# Patient Record
Sex: Female | Born: 1970 | Race: White | Hispanic: Yes | Marital: Married | Smoking: Never smoker
Health system: Southern US, Community
[De-identification: ages and names within clinical notes are randomized; demographics above are authoritative.]

## PROBLEM LIST (undated history)

## (undated) DIAGNOSIS — C801 Malignant (primary) neoplasm, unspecified: Secondary | ICD-10-CM

---

## 2007-05-29 ENCOUNTER — Encounter (INDEPENDENT_AMBULATORY_CARE_PROVIDER_SITE_OTHER): Payer: Self-pay | Admitting: Obstetrics and Gynecology

## 2007-05-29 ENCOUNTER — Inpatient Hospital Stay (HOSPITAL_COMMUNITY): Admission: EM | Admit: 2007-05-29 | Discharge: 2007-06-01 | Payer: Self-pay | Admitting: *Deleted

## 2007-05-31 ENCOUNTER — Other Ambulatory Visit: Payer: Self-pay | Admitting: Obstetrics and Gynecology

## 2007-06-01 ENCOUNTER — Other Ambulatory Visit: Payer: Self-pay | Admitting: Obstetrics and Gynecology

## 2008-12-11 IMAGING — US US OB TRANSVAGINAL MODIFY
1 series · 14 of 28 positions shown · non-contrast
Comparison: none

CLINICAL DATA: Pregnant, quantitative beta-hCG 9523. Hypotension and abdominal
pain

[Series 1: unknown · 0.17mm/px · 14 of 32 slices shown]
[im 2/32]
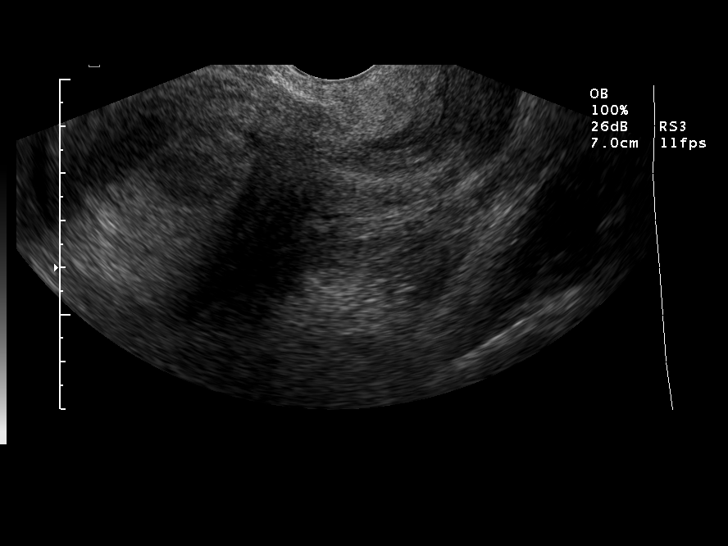
[im 4/32]
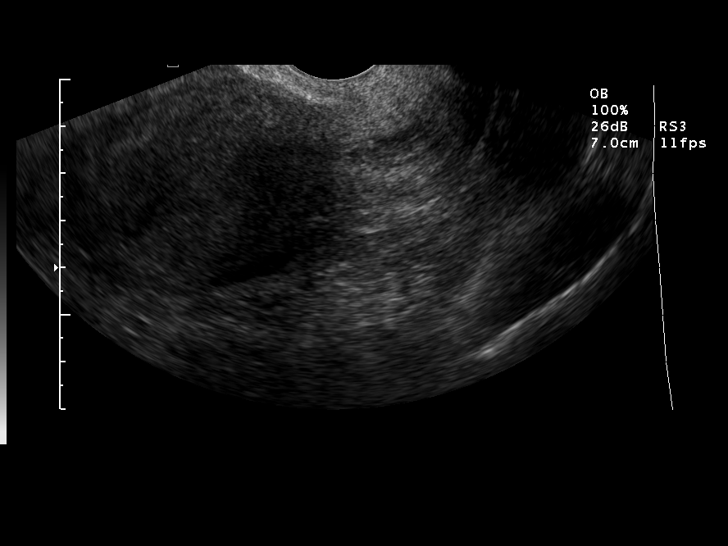
[im 6/32]
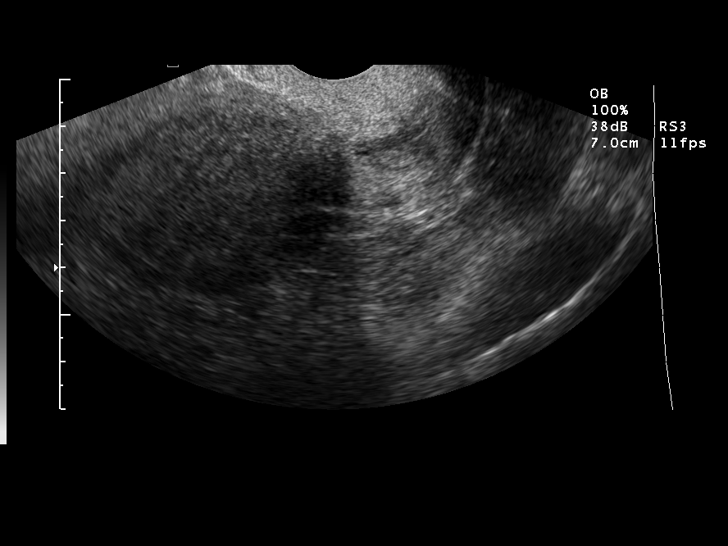
[im 9/32]
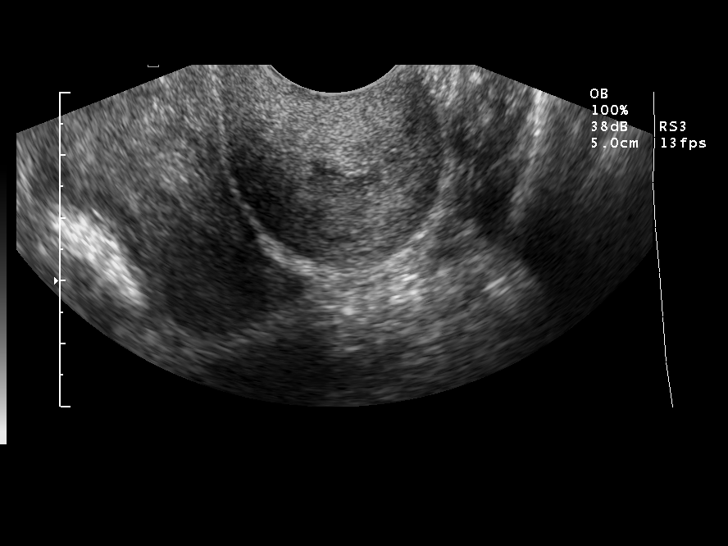
[im 11/32]
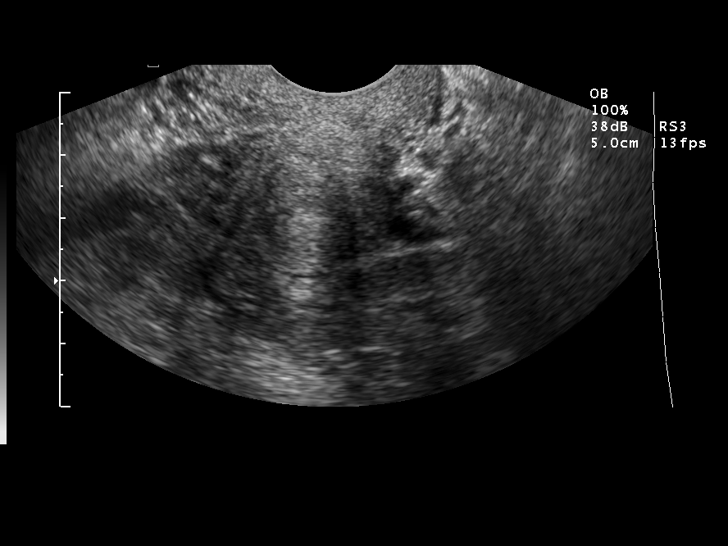
[im 13/32]
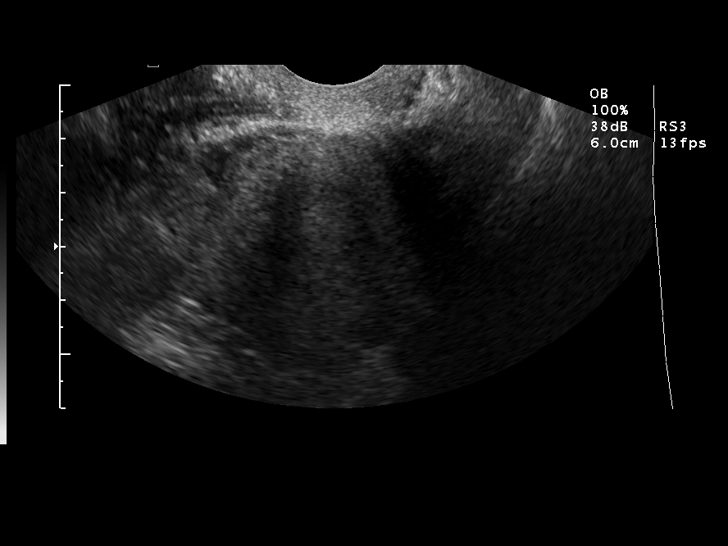
[im 15/32]
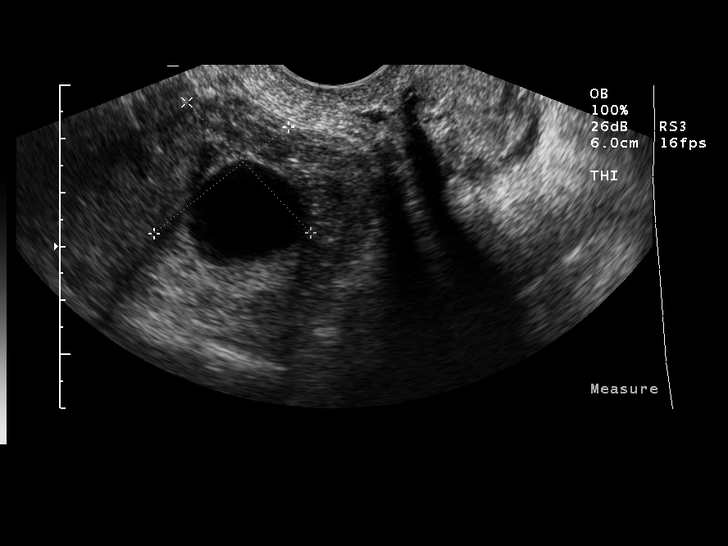
[im 18/32]
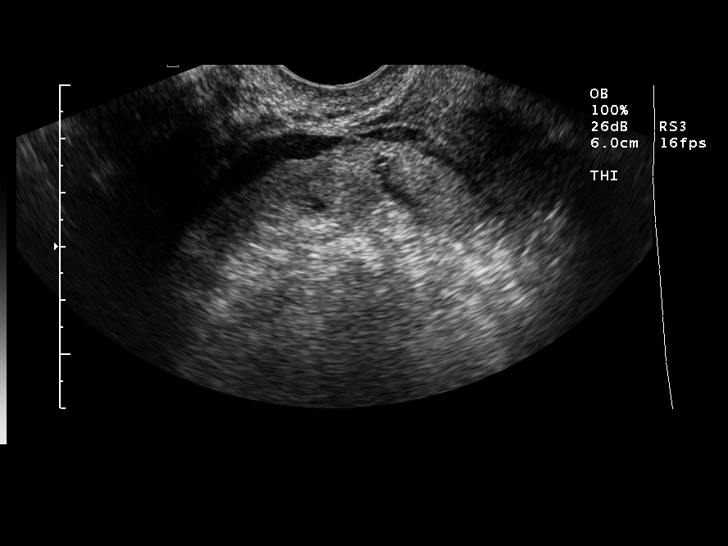
[im 20/32]
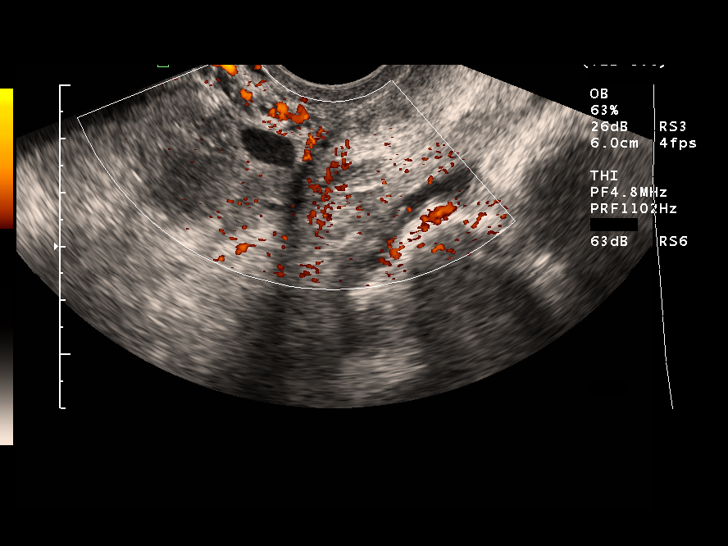
[im 22/32]
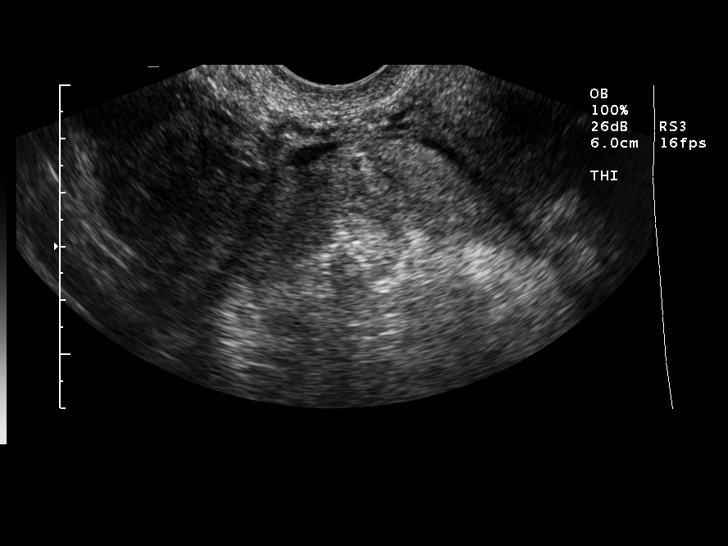
[im 25/32]
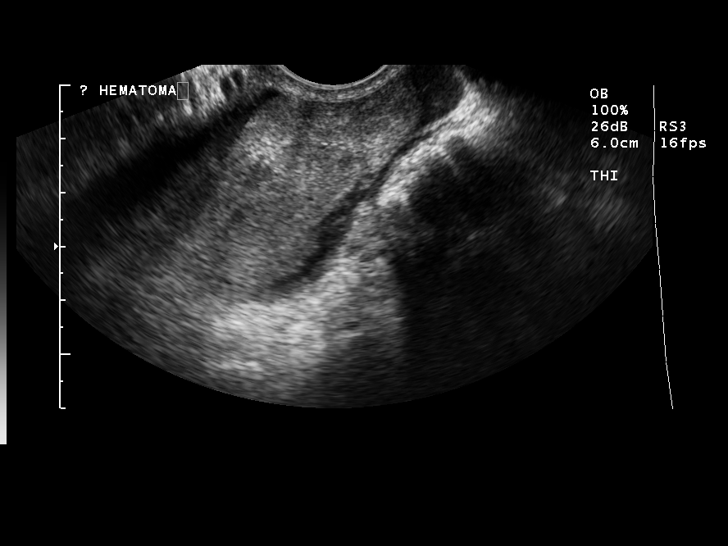
[im 27/32]
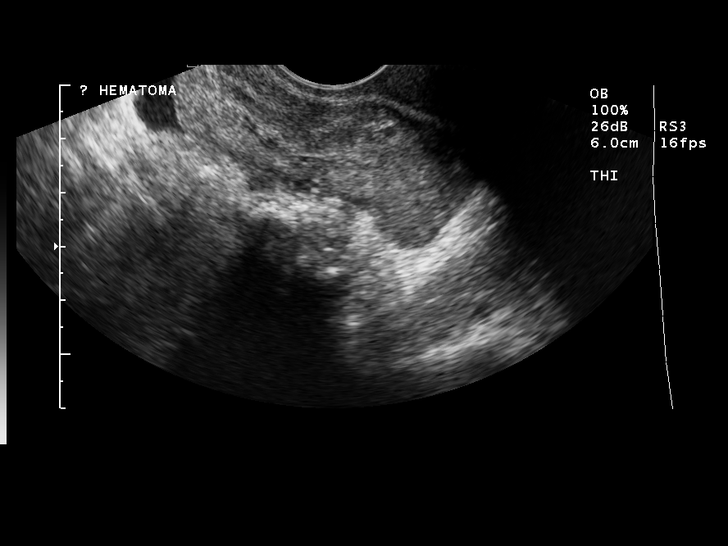
[im 29/32]
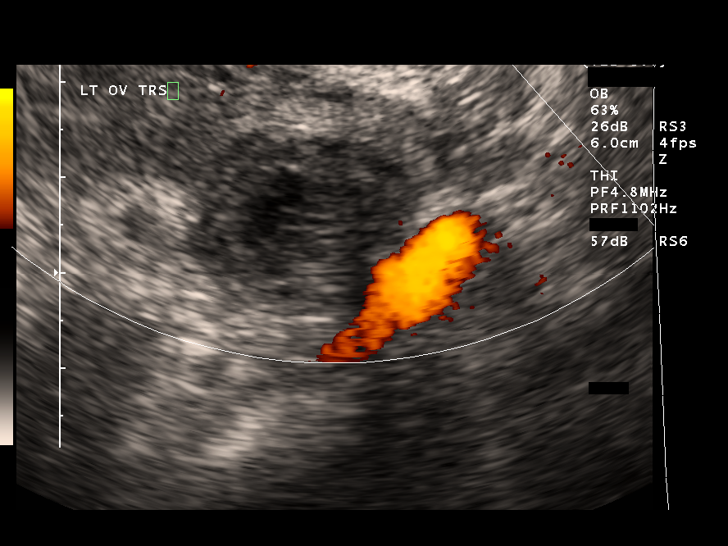
[im 32/32]
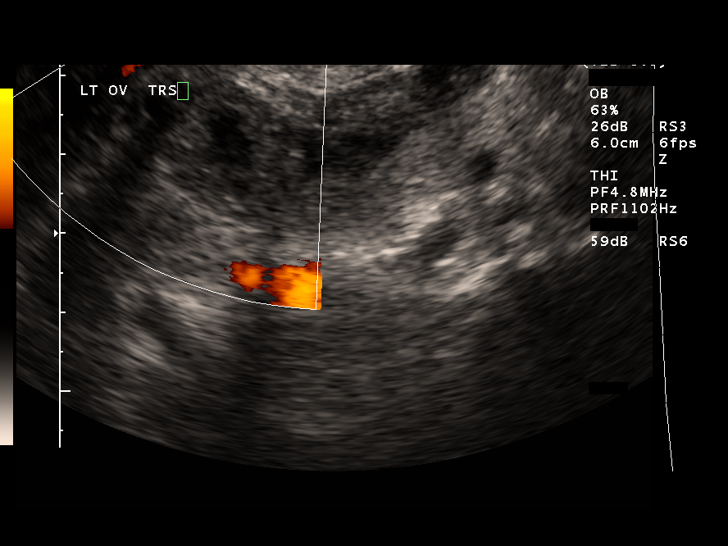

[14 of 28 positions shown; findings below may reference images not displayed]

Obstetric ultrasound:

No previous for comparison. There is no evidence of intrauterine gestation.
There is a large amount of free pelvic fluid with a somewhat confluent echogenic
focus 2.3 x 5.9 x 7.5 cm suggesting hematoma and clot formation. Right ovary 29
x 31 x 33 mm with a simple appearing 16 x 21 x 24 mm cyst. Fluid distends the
right fallopian tube. Left ovary 17 x 24 x 29 mm, containing a 18 mm thick
walled cystic process which does not show any significant hyperemia.
IMPRESSION: 1. Large amount of complex free fluid with no intrauterine gestational sac
evident consistent with ectopic pregnancy. Findings were discussed with Dr.
Beaux in the ED.

## 2010-11-28 NOTE — Op Note (Signed)
NAMELATRISHA, Catherine Robles           ACCOUNT NO.:  [id number]   MEDICAL RECORD NO.:  [id number]          PATIENT TYPE:  INP   LOCATION:  2550                         FACILITY:  [facility]   PHYSICIAN:  Rudy Jew. Ashley Royalty, M.D.DATE OF BIRTH:  October 18, 1970   DATE OF PROCEDURE:  05/29/2007  DATE OF DISCHARGE:                               OPERATIVE REPORT   PREOPERATIVE DIAGNOSES:  1. Positive quantitative beta HCG.  2. Hemoperitoneum.  3. No intrauterine pregnancy on ultrasound - probable ectopic      pregnancy.   POSTOPERATIVE DIAGNOSIS:  Right ampullary tubal pregnancy.   PROCEDURES:  1. Exploratory laparotomy.  2. Right salpingectomy.   SURGEON:  Rudy Jew. Ashley Royalty, M.D.   ANESTHESIA:  General.   ESTIMATED BLOOD LOSS:  700 mL.   COMPLICATIONS:  None.   PACKS AND DRAINS:  Foley.   Sponge needle and instrument count reported as correct x2.   PROCEDURE:  The patient was taken to the operating room and placed in  the dorsal supine position.  After general anesthesia was administered,  she was prepped and draped in the usual manner for abdominal surgery.  Foley catheter was previously placed.   The Pfannenstiel incision was made down at the level of the fascia which  was nicked with the knife and incised transversely with Mayo scissors.  The underlying rectus muscles were separated from the fascia using sharp  and blunt dissection.  The rectus muscles were separated in the midline  exposing the peritoneum, which was then elevated with hemostats and  entered atraumatically with Metzenbaum scissors and the incision was  extended longitudinally.  Immediately upon entry into the abdominal  cavity, a significant hemoperitoneum was encountered.  Copious suction  was accomplished and the abdominal cavity gradually cleared sufficiently  to enable visualization.  Balfour retractor was placed and the upper  abdomen explored and packed off.  The pelvis was visualized.  The uterus  was normal  size, shape and contour without evidence of any fibroids or  endometriosis.  The left ovary was normal size, shape and contour  without evidence of any cyst or endometriosis.  The left fallopian tube  was normal size, shape, length and contour with luxuriant fimbria.  Examination of the right adnexa revealed the ovary to be normal size,  shape and contour but adherent somewhat to the side wall posterior to  the uterus.  The right tube had a fusiform cyanotic swelling consistent  with an ampullary ectopic.  It was bleeding as well.  After careful  inspection, it was determined that the tube could be removed separately  from the ovary.  Hence, the mesosalpinx was serially clamped, cut in  order to release the tube at the level of the uterine cornu.  It was  submitted to pathology for histologic studies.  Each pedicle was secured  with 2-0 Vicryl.  Hemostasis was noted.  The ureter was noted to be well  below the plane of dissection.   At this point, copious irrigation was accomplished.  Careful inspection  for our hemostasis revealed excellent hemostasis.   At this point, the patient was felt to have  benefitted massively from  the surgical procedure.  All packs were removed.  The peritoneum was  then closed with 3-0 Vicryl in a running fashion.  The fascia was closed  with 0 Vicryl in a running fashion.  The skin was closed with staples.  The patient tolerated the procedure extremely well and was returned to  the recovery room in good condition.  As a precaution, she is to be  observed in the intensive care unit overnight.      James A. Ashley Royalty, M.D.  Electronically Signed     JAM/MEDQ  D:  05/29/2007  T:  05/30/2007  Job:  [id number]

## 2010-12-01 NOTE — Discharge Summary (Signed)
Catherine Robles, Catherine Robles           ACCOUNT NO.:  [id number]   MEDICAL RECORD NO.:  [id number]          PATIENT TYPE:  INP   LOCATION:  9307                          FACILITY:  [facility]   PHYSICIAN:  Rudy Jew. Ashley Royalty, M.D.DATE OF BIRTH:  [birth year]   DATE OF ADMISSION:  05/30/2007  DATE OF DISCHARGE:  06/01/2007                               DISCHARGE SUMMARY   DISCHARGE DIAGNOSIS:  Right ampullary ectopic pregnancy.   OPERATIONS AND PROCEDURES:  Exploratory laparotomy with right  salpingectomy.   CONSULTATIONS:  None.   DISCHARGE MEDICATIONS:  Percocet, Motrin, iron.   HISTORY AND PHYSICAL:  This is a 40 year old gravida 2, para 1, who  presented to the [facility] emergency department complaining of abdominal pain  since 2:30 p.m. on the day of admission.  She was worked up by the  emergency department physicians including an ultrasound, which revealed  no evidence of an intrauterine pregnancy.  The HCG was positive at 8639  milli-international units per milliliters.  She was subsequently  diagnosed as having an ectopic pregnancy and I was consulted as she was  an unassigned GYN patient.  For the remainder of the history and  physical, please see chart.   HOSPITAL COURSE:  The patient was admitted to [facility].  Admission laboratory studies were drawn.  After the evaluation, the  diagnosis of ectopic pregnancy was made and she was taken to the  operating room, at which time she underwent an exploratory laparotomy  and right salpingectomy.  There was a hemoperitoneum and an obvious  right ampullary ectopic.  The patient was transfused preoperatively by  the emergency department physicians.  After the procedure she was  transferred to [facility] for postoperative care.  Her  postoperative course was benign.  On June 01, 2007, she was felt to  be a candidate for discharge home and was discharged home afebrile and  in satisfactory condition.  The pathology returned showing  a right tubal  ectopic.   DISPOSITION:  The patient is to return to [facility] in approximately 2 weeks for postoperative evaluation.      James A. Ashley Royalty, M.D.  Electronically Signed     JAM/MEDQ  D:  06/26/2007  T:  06/26/2007  Job:  [id number]

## 2011-04-24 LAB — URINALYSIS, ROUTINE W REFLEX MICROSCOPIC
Glucose, UA: 500 — AB
Ketones, ur: NEGATIVE
Nitrite: POSITIVE — AB
Specific Gravity, Urine: 1.016
Urobilinogen, UA: 0.2
pH: 7

## 2011-04-24 LAB — TYPE AND SCREEN

## 2011-04-24 LAB — CBC
HCT: 24.7 — ABNORMAL LOW
Hemoglobin: 6.3 — CL
MCHC: 35.7
MCV: 85.7
MCV: 87.2
RBC: 2.02 — ABNORMAL LOW
RBC: 3.46 — ABNORMAL LOW
RDW: 13.9
RDW: 18.3 — ABNORMAL HIGH
WBC: 11.5 — ABNORMAL HIGH
WBC: 13.5 — ABNORMAL HIGH

## 2011-04-24 LAB — DIFFERENTIAL
Basophils Absolute: 0
Lymphs Abs: 1
Monocytes Absolute: 0.2

## 2011-04-24 LAB — I-STAT 8, (EC8 V) (CONVERTED LAB)
BUN: 15
Bicarbonate: 10.9 — ABNORMAL LOW
Chloride: 111
Glucose, Bld: 164 — ABNORMAL HIGH
HCT: 18 — ABNORMAL LOW
Hemoglobin: 6.1 — CL
Sodium: 140

## 2011-04-24 LAB — ABO/RH: ABO/RH(D): O POS

## 2011-04-24 LAB — PROTIME-INR
INR: 1.3
Prothrombin Time: 16.8 — ABNORMAL HIGH

## 2011-04-24 LAB — URINE MICROSCOPIC-ADD ON

## 2011-04-24 LAB — HEMOGLOBIN AND HEMATOCRIT, BLOOD: Hemoglobin: 8.6 — ABNORMAL LOW

## 2011-04-24 LAB — POCT I-STAT 4, (NA,K, GLUC, HGB,HCT)
HCT: 22 — ABNORMAL LOW
Hemoglobin: 7.5 — CL
Operator id: 133981
Sodium: 133 — ABNORMAL LOW

## 2011-04-24 LAB — POCT PREGNANCY, URINE
Operator id: 27011
Preg Test, Ur: POSITIVE

## 2017-09-18 ENCOUNTER — Encounter: Payer: Self-pay | Admitting: Emergency Medicine

## 2017-09-18 ENCOUNTER — Ambulatory Visit (INDEPENDENT_AMBULATORY_CARE_PROVIDER_SITE_OTHER): Payer: Self-pay | Admitting: Emergency Medicine

## 2017-09-18 ENCOUNTER — Other Ambulatory Visit: Payer: Self-pay

## 2017-09-18 ENCOUNTER — Inpatient Hospital Stay (HOSPITAL_COMMUNITY): Admission: RE | Admit: 2017-09-18 | Payer: Self-pay | Source: Ambulatory Visit

## 2017-09-18 VITALS — BP 144/90 | HR 89 | Temp 98.0°F | Resp 18 | Ht 65.0 in | Wt 165.8 lb

## 2017-09-18 DIAGNOSIS — M79604 Pain in right leg: Secondary | ICD-10-CM

## 2017-09-18 DIAGNOSIS — M5431 Sciatica, right side: Secondary | ICD-10-CM | POA: Insufficient documentation

## 2017-09-18 MED ORDER — DICLOFENAC SODIUM 75 MG PO TBEC: 75.0000 mg | DELAYED_RELEASE_TABLET | Freq: Two times a day (BID) | ORAL | 0 refills | Status: AC

## 2017-09-18 MED ORDER — CYCLOBENZAPRINE HCL 10 MG PO TABS: 10.0000 mg | ORAL_TABLET | Freq: Every day | ORAL | 0 refills | Status: AC

## 2017-09-18 NOTE — Progress Notes (Signed)
Catherine Robles 47 y.o.   Chief Complaint  Patient presents with  . Fall    X 5 mth  . Leg Swelling    X 3 days - left leg    HISTORY OF PRESENT ILLNESS: This is a 47 y.o. female complaining of pain to right lower back radiating into her right buttock and back of the right leg for 5-6 months since she fell and landed on her butt.  Pain comes and goes.  Hurts to move.  Also noticed some swelling to the thigh area.  No bladder or bowel problems.  Back Pain  This is a new problem. The current episode started more than 1 month ago. The problem occurs intermittently. The problem has been waxing and waning since onset. The pain is present in the lumbar spine and gluteal. The quality of the pain is described as burning and aching. The pain radiates to the right knee and right thigh. The pain is at a severity of 5/10. The pain is moderate. The pain is the same all the time. The symptoms are aggravated by bending, lying down, standing and position. Associated symptoms include leg pain. Pertinent negatives include no abdominal pain, bladder incontinence, bowel incontinence, chest pain, dysuria, fever, headaches, numbness, paresis, paresthesias, pelvic pain, perianal numbness, tingling, weakness or weight loss. Risk factors: none. She has tried NSAIDs for the symptoms. The treatment provided mild relief.     Prior to Admission medications   Not on File    No Known Allergies  There are no active problems to display for this patient.   History reviewed. No pertinent past medical history.  History reviewed. No pertinent surgical history.  Social History   Socioeconomic History  . Marital status: Married    Spouse name: Not on file  . Number of children: 3  . Years of education: Not on file  . Highest education level: Not on file  Social Needs  . Financial resource strain: Not on file  . Food insecurity - worry: Not on file  . Food insecurity - inability: Not on file  . Transportation  needs - medical: Not on file  . Transportation needs - non-medical: Not on file  Occupational History  . Not on file  Tobacco Use  . Smoking status: Never Smoker  . Smokeless tobacco: Never Used  Substance and Sexual Activity  . Alcohol use: No    Frequency: Never  . Drug use: No  . Sexual activity: Yes  Other Topics Concern  . Not on file  Social History Narrative  . Not on file    History reviewed. No pertinent family history.   Review of Systems  Constitutional: Negative for fever and weight loss.  HENT: Negative.  Negative for sore throat.   Eyes: Negative.   Respiratory: Negative for cough and shortness of breath.   Cardiovascular: Negative for chest pain and palpitations.  Gastrointestinal: Negative for abdominal pain, blood in stool, bowel incontinence and diarrhea.  Genitourinary: Negative for bladder incontinence, dysuria and pelvic pain.  Musculoskeletal: Positive for back pain.  Skin: Negative for rash.  Neurological: Negative for tingling, sensory change, focal weakness, weakness, numbness, headaches and paresthesias.  Endo/Heme/Allergies: Negative.   All other systems reviewed and are negative.  Vitals:   09/18/17 1034  BP: (!) 144/90  Pulse: 89  Resp: 18  Temp: 98 F (36.7 C)  SpO2: 99%     Physical Exam  Constitutional: She is oriented to person, place, and time. She appears well-developed and  well-nourished.  HENT:  Head: Normocephalic and atraumatic.  Eyes: EOM are normal. Pupils are equal, round, and reactive to light.  Neck: Normal range of motion. Neck supple.  Cardiovascular: Normal rate and regular rhythm.  Pulmonary/Chest: Effort normal and breath sounds normal.  Abdominal: Soft. Bowel sounds are normal. She exhibits no distension. There is no tenderness.  Musculoskeletal:       Lumbar back: She exhibits decreased range of motion, tenderness and spasm. She exhibits no bony tenderness and normal pulse.       Back:  Right lower  extremity: No significant swelling or tenderness.  NVI.  Neurological: She is alert and oriented to person, place, and time. She displays normal reflexes. No sensory deficit. She exhibits normal muscle tone. Coordination normal.  Skin: Skin is warm and dry. Capillary refill takes less than 2 seconds.  Psychiatric: She has a normal mood and affect. Her behavior is normal.  Vitals reviewed.    ASSESSMENT & PLAN: Charlyn was seen today for fall and leg swelling.  Diagnoses and all orders for this visit:  Pain of right lower extremity -     VAS [place] LOWER EXTREMITY VENOUS (DVT); Future -     diclofenac (VOLTAREN) 75 MG EC tablet; Take 1 tablet (75 mg total) by mouth 2 (two) times daily for 5 days.  Sciatica of right side -     cyclobenzaprine (FLEXERIL) 10 MG tablet; Take 1 tablet (10 mg total) by mouth at bedtime. -     diclofenac (VOLTAREN) 75 MG EC tablet; Take 1 tablet (75 mg total) by mouth 2 (two) times daily for 5 days.    Patient Instructions     [facility] [place], [place], [place] [address] Main entrance and check in at 12:45pm today For - VAS [place] LOWER EXTREMITY VENOUS (DVT)    IF you received an x-ray today, you will receive an invoice from [facility] Radiology. Please contact [facility] Radiology at [phone] with questions or concerns regarding your invoice.   IF you received labwork today, you will receive an invoice from [facility]. Please contact [facility] at [phone] with questions or concerns regarding your invoice.   Our billing staff will not be able to assist you with questions regarding bills from these companies.  You will be contacted with the lab results as soon as they are available. The fastest way to get your results is to activate your My Chart account. Instructions are located on the last page of this paperwork. If you have not heard from [place] regarding the results in 2 weeks, please contact this office.     Citica (Sciatica) La  citica es el dolor, la debilidad, el entumecimiento o el hormigueo a lo largo del nervio citico. El nervio citico comienza en la parte inferior de la espalda y desciende por la parte posterior de cada pierna. La citica se produce cuando este nervio se comprime o se ejerce presin sobre l. Suele desaparecer por s sola o con tratamiento. A veces, la citica puede volver a aparecer. CUIDADOS EN EL HOGAR [employer] de venta libre y los recetados solamente como se lo haya indicado el mdico.  No conduzca ni use maquinaria pesada mientras toma analgsicos recetados. Control del dolor  Si se lo indican, aplique hielo sobre la zona afectada. ? Ponga el hielo en una bolsa plstica. ? Coloque una [employer] piel y la bolsa de hielo. ? Coloque el hielo durante 61minutos, 2 a 3veces por da.  Despus del hielo, aplique calor sobre la zona afectada antes de hacer ejercicio o con la frecuencia que le haya indicado el mdico. Use la fuente de calor que el mdico le indique, por ejemplo, una compresa de calor hmedo o una almohadilla trmica. ? Coloque una [employer] piel y la fuente de Freight forwarder. ? Aplique el calor durante 20 a 71minutos. ? Retire la fuente de calor si la piel se le pone de color rojo brillante. Esto es muy importante si no puede sentir el dolor, el calor o el fro. Puede correr un riesgo mayor de sufrir quemaduras. Actividad  Retome sus actividades habituales como se lo haya indicado el mdico. Pregntele al mdico qu actividades son seguras para usted. ? Evite las actividades que empeoran la citica.  Descanse por breves perodos Agricultural consultant. Hgalo recostado o de pie. Esto suele ser mejor que descansar sentado. ? Cuando descanse durante perodos ms largos, haga alguna actividad fsica o un estiramiento entre los perodos de descanso. ? Evite estar sentado durante largos perodos sin moverse. [place] y [place] al menos una vez cada  hora.  Haga ejercicios y estrese con regularidad, como se lo indic el mdico.  No levante nada que pese ms de 10libras (4,5kg) mientras tenga sntomas de citica. ? Aunque no tenga sntomas, evite levantar objetos pesados. ? Evite levantar objetos pesados de forma repetida.  Al levantar objetos, hgalos siempre de una forma que sea segura para su cuerpo. Para esto, debe hacer lo siguiente: ? [place]. ? Mantenga el objeto cerca del cuerpo. ? No se tuerza. Instrucciones generales  Mantenga una buena postura. ? Evite reclinarse hacia adelante cuando est sentado. ? Evite encorvar la espalda mientras est de pie.  Mantenga un peso saludable.  Use calzado cmodo, que le d soporte al pie. Evite usar tacones.  Evite dormir sobre un colchn que sea demasiado blando o demasiado duro. Es posible que sienta menos dolor si duerme en un colchn con apoyo suficientemente firme para la espalda.  Concurra a todas las visitas de control como se lo haya indicado el mdico. Esto es importante. SOLICITE AYUDA SI:  Tiene un dolor con estas caractersticas: ? Lo despierta cuando est dormido. ? Empeora al estar recostado. ? Es Event organiser que tena en el pasado. ? Dura ms de 4semanas.  Pierde peso sin proponrselo. SOLICITE AYUDA DE INMEDIATO SI:  No puede controlar la orina (miccin) ni la evacuacin de la materia fecal (defecacin).  Tiene debilidad en alguna de estas zonas, y la debilidad empeora. ? La parte inferior de la espalda. ? La parte inferior del abdomen (pelvis). ? Los glteos. ? Las piernas.  Siente irritacin o inflamacin en la espalda.  Tiene sensacin de ardor al [employer]. Esta informacin no tiene Marine scientist el consejo del mdico. Asegrese de hacerle al mdico cualquier pregunta que tenga. Document Released: 08/04/2010 Document Revised: 10/24/2015 Document Reviewed: 03/11/2015 Elsevier Interactive Patient Education  2018 Elsevier  Inc.      Agustina Caroli, MD [facility]

## 2017-09-18 NOTE — Patient Instructions (Addendum)
[facility] [place], [place], [place] [address] Main entrance and check in at 12:45pm today For - VAS [place] LOWER EXTREMITY VENOUS (DVT)    IF you received an x-ray today, you will receive an invoice from [facility] Radiology. Please contact [facility] Radiology at [phone] with questions or concerns regarding your invoice.   IF you received labwork today, you will receive an invoice from [facility] [place]. Please contact [facility] at [phone] with questions or concerns regarding your invoice.   Our billing staff will not be able to assist you with questions regarding bills from these companies.  You will be contacted with the lab results as soon as they are available. The fastest way to get your results is to activate your My Chart account. Instructions are located on the last page of this paperwork. If you have not heard from [place] regarding the results in 2 weeks, please contact this office.     Citica (Sciatica) La citica es el dolor, la debilidad, el entumecimiento o el hormigueo a lo largo del nervio citico. El nervio citico comienza en la parte inferior de la espalda y desciende por la parte posterior de cada pierna. La citica se produce cuando este nervio se comprime o se ejerce presin sobre l. Suele desaparecer por s sola o con tratamiento. A veces, la citica puede volver a aparecer. CUIDADOS EN EL HOGAR [employer]amentos  [employer] de venta libre y los recetados solamente como se lo haya indicado el mdico.  No conduzca ni use maquinaria pesada mientras toma analgsicos recetados. Control del dolor  Si se lo indican, aplique hielo sobre la zona afectada. ? Ponga el hielo en una bolsa plstica. ? Coloque una [employer] piel y la bolsa de hielo. ? Coloque el hielo durante 103minutos, 2 a 3veces por da.  Despus del hielo, aplique calor sobre la zona afectada antes de hacer ejercicio o con la frecuencia que le haya indicado el mdico. Use la  fuente de calor que el mdico le indique, por ejemplo, una compresa de calor hmedo o una almohadilla trmica. ? Coloque una [employer] piel y la fuente de Freight forwarder. ? Aplique el calor durante 20 a 7minutos. ? Retire la fuente de calor si la piel se le pone de color rojo brillante. Esto es muy importante si no puede sentir el dolor, el calor o el fro. Puede correr un riesgo mayor de sufrir quemaduras. Actividad  Retome sus actividades habituales como se lo haya indicado el mdico. Pregntele al mdico qu actividades son seguras para usted. ? Evite las actividades que empeoran la citica.  Descanse por breves perodos Agricultural consultant. Hgalo recostado o de pie. Esto suele ser mejor que descansar sentado. ? Cuando descanse durante perodos ms largos, haga alguna actividad fsica o un estiramiento entre los perodos de descanso. ? Evite estar sentado durante largos perodos sin moverse. Levntese y [place] al menos una vez cada hora.  Haga ejercicios y estrese con regularidad, como se lo indic el mdico.  No levante nada que pese ms de 10libras (4,5kg) mientras tenga sntomas de citica. ? Aunque no tenga sntomas, evite levantar objetos pesados. ? Evite levantar objetos pesados de forma repetida.  Al levantar objetos, hgalos siempre de una forma que sea segura para su cuerpo. Para esto, debe hacer lo siguiente: ? [place]. ? Mantenga el objeto cerca del cuerpo. ? No se tuerza. Instrucciones generales  Mantenga una buena postura. ? Evite reclinarse hacia adelante cuando est sentado. ?  Evite encorvar la espalda mientras est de pie.  Mantenga un peso saludable.  Use calzado cmodo, que le d soporte al pie. Evite usar tacones.  Evite dormir sobre un colchn que sea demasiado blando o demasiado duro. Es posible que sienta menos dolor si duerme en un colchn con apoyo suficientemente firme para la espalda.  Concurra a todas las visitas de control como se lo haya  indicado el mdico. Esto es importante. SOLICITE AYUDA SI:  Tiene un dolor con estas caractersticas: ? Lo despierta cuando est dormido. ? Empeora al estar recostado. ? Es Event organiser que tena en el pasado. ? Dura ms de 4semanas.  Pierde peso sin proponrselo. SOLICITE AYUDA DE INMEDIATO SI:  No puede controlar la orina (miccin) ni la evacuacin de la materia fecal (defecacin).  Tiene debilidad en alguna de estas zonas, y la debilidad empeora. ? La parte inferior de la espalda. ? La parte inferior del abdomen (pelvis). ? Los glteos. ? Las piernas.  Siente irritacin o inflamacin en la espalda.  Tiene sensacin de ardor al [employer]. Esta informacin no tiene Marine scientist el consejo del mdico. Asegrese de hacerle al mdico cualquier pregunta que tenga. Document Released: 08/04/2010 Document Revised: 10/24/2015 Document Reviewed: 03/11/2015 Elsevier Interactive Patient Education  [employer].

## 2018-04-12 ENCOUNTER — Encounter (HOSPITAL_COMMUNITY): Payer: Self-pay

## 2018-04-12 ENCOUNTER — Inpatient Hospital Stay (HOSPITAL_COMMUNITY)
Admission: EM | Admit: 2018-04-12 | Discharge: 2018-04-15 | DRG: 871 | Disposition: E | Payer: Medicaid Other | Attending: Internal Medicine | Admitting: Internal Medicine

## 2018-04-12 ENCOUNTER — Emergency Department (HOSPITAL_COMMUNITY): Payer: Medicaid Other

## 2018-04-12 ENCOUNTER — Other Ambulatory Visit: Payer: Self-pay

## 2018-04-12 DIAGNOSIS — T451X5A Adverse effect of antineoplastic and immunosuppressive drugs, initial encounter: Secondary | ICD-10-CM | POA: Diagnosis present

## 2018-04-12 DIAGNOSIS — D539 Nutritional anemia, unspecified: Secondary | ICD-10-CM | POA: Diagnosis present

## 2018-04-12 DIAGNOSIS — Z66 Do not resuscitate: Secondary | ICD-10-CM | POA: Diagnosis present

## 2018-04-12 DIAGNOSIS — J9601 Acute respiratory failure with hypoxia: Secondary | ICD-10-CM | POA: Diagnosis present

## 2018-04-12 DIAGNOSIS — Z9221 Personal history of antineoplastic chemotherapy: Secondary | ICD-10-CM

## 2018-04-12 DIAGNOSIS — Y95 Nosocomial condition: Secondary | ICD-10-CM | POA: Diagnosis present

## 2018-04-12 DIAGNOSIS — D6959 Other secondary thrombocytopenia: Secondary | ICD-10-CM | POA: Diagnosis present

## 2018-04-12 DIAGNOSIS — Z515 Encounter for palliative care: Secondary | ICD-10-CM | POA: Diagnosis present

## 2018-04-12 DIAGNOSIS — C419 Malignant neoplasm of bone and articular cartilage, unspecified: Secondary | ICD-10-CM | POA: Diagnosis present

## 2018-04-12 DIAGNOSIS — J90 Pleural effusion, not elsewhere classified: Secondary | ICD-10-CM | POA: Diagnosis present

## 2018-04-12 DIAGNOSIS — A419 Sepsis, unspecified organism: Secondary | ICD-10-CM | POA: Diagnosis present

## 2018-04-12 DIAGNOSIS — J189 Pneumonia, unspecified organism: Secondary | ICD-10-CM | POA: Diagnosis present

## 2018-04-12 DIAGNOSIS — R6521 Severe sepsis with septic shock: Secondary | ICD-10-CM | POA: Diagnosis present

## 2018-04-12 DIAGNOSIS — C799 Secondary malignant neoplasm of unspecified site: Secondary | ICD-10-CM | POA: Diagnosis present

## 2018-04-12 HISTORY — DX: Malignant (primary) neoplasm, unspecified: C80.1

## 2018-04-12 LAB — COMPREHENSIVE METABOLIC PANEL
ALK PHOS: 388 U/L — AB (ref 38–126)
ANION GAP: 20 — AB (ref 5–15)
AST: 25 U/L (ref 15–41)
Albumin: 1.4 g/dL — ABNORMAL LOW (ref 3.5–5.0)
BILIRUBIN TOTAL: 1.3 mg/dL — AB (ref 0.3–1.2)
BUN: 14 mg/dL (ref 6–20)
CALCIUM: 7.9 mg/dL — AB (ref 8.9–10.3)
CO2: 15 mmol/L — ABNORMAL LOW (ref 22–32)
Chloride: 99 mmol/L (ref 98–111)
Creatinine, Ser: 0.88 mg/dL (ref 0.44–1.00)
GFR calc Af Amer: >60 mL/min (ref >60)
Glucose, Bld: 184 mg/dL — ABNORMAL HIGH (ref 70–99)
Potassium: 5.6 mmol/L — ABNORMAL HIGH (ref 3.5–5.1)
Sodium: 134 mmol/L — ABNORMAL LOW (ref 135–145)
TOTAL PROTEIN: 5.7 g/dL — AB (ref 6.5–8.1)

## 2018-04-12 LAB — CBC WITH DIFFERENTIAL/PLATELET
BASOS PCT: 0 %
BLASTS: 0 %
Band Neutrophils: 39 %
Basophils Absolute: 0 10*3/uL (ref 0.0–0.1)
Eosinophils Absolute: 1.2 10*3/uL — ABNORMAL HIGH (ref 0.0–0.7)
Eosinophils Relative: 3 %
HCT: 34 % — ABNORMAL LOW (ref 36.0–46.0)
Hemoglobin: 10.6 g/dL — ABNORMAL LOW (ref 12.0–15.0)
Lymphocytes Relative: 11 %
Lymphs Abs: 4.4 10*3/uL — ABNORMAL HIGH (ref 0.7–4.0)
MCH: 34.5 pg — ABNORMAL HIGH (ref 26.0–34.0)
MCHC: 31.2 g/dL (ref 30.0–36.0)
MCV: 110.7 fL — ABNORMAL HIGH (ref 78.0–100.0)
MONO ABS: 1.2 10*3/uL — AB (ref 0.1–1.0)
Metamyelocytes Relative: 1 %
Monocytes Relative: 3 %
Myelocytes: 2 %
NEUTROS PCT: 41 %
NRBC: 2 /100{WBCs} — AB
Neutro Abs: 33.6 10*3/uL — ABNORMAL HIGH (ref 1.7–7.7)
Other: 0 %
PLATELETS: 52 10*3/uL — AB (ref 150–400)
PROMYELOCYTES RELATIVE: 0 %
RBC: 3.07 MIL/uL — AB (ref 3.87–5.11)
RDW: 23.3 % — AB (ref 11.5–15.5)
WBC Morphology: INCREASED
WBC: 40.4 10*3/uL — AB (ref 4.0–10.5)

## 2018-04-12 LAB — I-STAT CHEM 8, ED
BUN: 16 mg/dL (ref 6–20)
CALCIUM ION: 0.96 mmol/L — AB (ref 1.15–1.40)
Chloride: 101 mmol/L (ref 98–111)
Creatinine, Ser: 0.5 mg/dL (ref 0.44–1.00)
Glucose, Bld: 156 mg/dL — ABNORMAL HIGH (ref 70–99)
HCT: 35 % — ABNORMAL LOW (ref 36.0–46.0)
HEMOGLOBIN: 11.9 g/dL — AB (ref 12.0–15.0)
Potassium: 5.9 mmol/L — ABNORMAL HIGH (ref 3.5–5.1)
SODIUM: 130 mmol/L — AB (ref 135–145)
TCO2: 18 mmol/L — AB (ref 22–32)

## 2018-04-12 LAB — I-STAT TROPONIN, ED: TROPONIN I, POC: 0.02 ng/mL (ref 0.00–0.08)

## 2018-04-12 LAB — I-STAT CG4 LACTIC ACID, ED: Lactic Acid, Venous: 12.21 mmol/L (ref 0.5–1.9)

## 2018-04-12 MED ORDER — ACETAMINOPHEN 325 MG PO TABS: 650.0000 mg | ORAL_TABLET | Freq: Four times a day (QID) | ORAL | Status: DC | PRN

## 2018-04-12 MED ORDER — MORPHINE 100MG IN NS 100ML (1MG/ML) PREMIX INFUSION
1.0000 mg/h | INTRAVENOUS | Status: DC
  Filled 2018-04-12: qty 100

## 2018-04-12 MED ORDER — ONDANSETRON HCL 4 MG/2ML IJ SOLN: 4.0000 mg | Freq: Four times a day (QID) | INTRAMUSCULAR | Status: DC | PRN

## 2018-04-12 MED ORDER — MORPHINE 100MG IN NS 100ML (1MG/ML) PREMIX INFUSION
1.0000 mg/h | INTRAVENOUS | Status: DC
  Administered 2018-04-12: 1 mg/h via INTRAVENOUS
  Filled 2018-04-12: qty 100

## 2018-04-12 MED ORDER — SODIUM CHLORIDE 0.9 % IV BOLUS
1000.0000 mL | Freq: Once | INTRAVENOUS | Status: AC
  Administered 2018-04-12: 1000 mL via INTRAVENOUS

## 2018-04-12 MED ORDER — ACETAMINOPHEN 650 MG RE SUPP: 650.0000 mg | Freq: Four times a day (QID) | RECTAL | Status: DC | PRN

## 2018-04-12 MED ORDER — KETOROLAC TROMETHAMINE 30 MG/ML IJ SOLN
30.0000 mg | Freq: Once | INTRAMUSCULAR | Status: AC
  Administered 2018-04-12: 30 mg via INTRAVENOUS
  Filled 2018-04-12: qty 1

## 2018-04-12 MED ORDER — ONDANSETRON HCL 4 MG PO TABS: 4.0000 mg | ORAL_TABLET | Freq: Four times a day (QID) | ORAL | Status: DC | PRN

## 2018-04-12 NOTE — ED Notes (Signed)
ED TO INPATIENT HANDOFF REPORT  Name/Age/Gender Catherine Robles 47 y.o. female  Code Status    Code Status Orders  (From admission, onward)         Start     Ordered   04/06/2018 1948  Do not attempt resuscitation/DNR  Continuous    Question Answer Comment  In the event of cardiac or respiratory ARREST Do not call a "code blue"   In the event of cardiac or respiratory ARREST Do not perform Intubation, CPR, defibrillation or ACLS   In the event of cardiac or respiratory ARREST Use medication by any route, position, wound care, and other measures to relive pain and suffering. May use oxygen, suction and manual treatment of airway obstruction as needed for comfort.   Comments Confirmed with the husband      03/27/2018 1947        Code Status History    This patient has a current code status but no historical code status.      Home/SNF/Other Home  Chief Complaint Sepsis, CA pt  Level of Care/Admitting Diagnosis ED Disposition    ED Disposition Condition Comment   Admit  Hospital Area: [facility] [[id number]]  Level of Care: Stepdown [14]  Admit to SDU based on following criteria: Respiratory Distress:  Frequent assessment and/or intervention to maintain adequate ventilation/respiration, pulmonary toilet, and respiratory treatment.  Diagnosis: Acute respiratory failure with hypoxia [facility]) [[id number]]  Admitting Physician: Rise Patience [phone]  Attending Physician: Rise Patience [phone]  Estimated length of stay: past midnight tomorrow  Certification:: I certify this patient will need inpatient services for at least 2 midnights  PT Class (Do Not Modify): Inpatient [101]  PT Acc Code (Do Not Modify): Private [1]       Medical History History reviewed. No pertinent past medical history.  Allergies No Known Allergies  IV Location/Drains/Wounds Patient Lines/Drains/Airways Status   Active Line/Drains/Airways    Name:   Placement date:    Placement time:   Site:   Days:   Peripheral IV 04/09/2018 Left Antecubital   03/19/2018    1752    Antecubital   less than 1   Peripheral IV 03/18/2018 Right Antecubital   04/11/2018    1822    Antecubital   less than 1          Labs/Imaging Results for orders placed or performed during the hospital encounter of 04/01/2018 (from the past 48 hour(s))  I-Stat CG4 Lactic Acid, ED  (not at  [facility])     Status: Abnormal   Collection Time: 03/31/2018  6:09 PM  Result Value Ref Range   Lactic Acid, Venous 12.21 (HH) 0.5 - 1.9 mmol/L   Comment NOTIFIED PHYSICIAN   I-stat troponin, ED (not at [facility])     Status: None   Collection Time: 04/02/2018  6:10 PM  Result Value Ref Range   Troponin i, poc 0.02 0.00 - 0.08 ng/mL   Comment 3            Comment: Due to the release kinetics of cTnI, a negative result within the first hours of the onset of symptoms does not rule out myocardial infarction with certainty. If myocardial infarction is still suspected, repeat the test at appropriate intervals.   I-stat Chem 8, ED     Status: Abnormal   Collection Time: 03/17/2018  6:10 PM  Result Value Ref Range   Sodium 130 (L) 135 - 145 mmol/L   Potassium 5.9 (H) 3.5 -  5.1 mmol/L   Chloride 101 98 - 111 mmol/L   BUN 16 6 - 20 mg/dL   Creatinine, Ser 0.50 0.44 - 1.00 mg/dL   Glucose, Bld 156 (H) 70 - 99 mg/dL   Calcium, Ion 0.96 (L) 1.15 - 1.40 mmol/L   TCO2 18 (L) 22 - 32 mmol/L   Hemoglobin 11.9 (L) 12.0 - 15.0 g/dL   HCT 35.0 (L) 36.0 - 46.0 %  Comprehensive metabolic panel     Status: Abnormal   Collection Time: 04/14/2018  6:14 PM  Result Value Ref Range   Sodium 134 (L) 135 - 145 mmol/L   Potassium 5.6 (H) 3.5 - 5.1 mmol/L   Chloride 99 98 - 111 mmol/L   CO2 15 (L) 22 - 32 mmol/L   Glucose, Bld 184 (H) 70 - 99 mg/dL   BUN 14 6 - 20 mg/dL   Creatinine, Ser 0.88 0.44 - 1.00 mg/dL   Calcium 7.9 (L) 8.9 - 10.3 mg/dL   Total Protein 5.7 (L) 6.5 - 8.1 g/dL   Albumin 1.4 (L) 3.5 - 5.0 g/dL   AST 25 15 - 41  U/L   ALT <5 0 - 44 U/L   Alkaline Phosphatase 388 (H) 38 - 126 U/L   Total Bilirubin 1.3 (H) 0.3 - 1.2 mg/dL   GFR calc non Af Amer >60 >60 mL/min   GFR calc Af Amer >60 >60 mL/min    Comment: (NOTE) The eGFR has been calculated using the CKD EPI equation. This calculation has not been validated in all clinical situations. eGFR's persistently <60 mL/min signify possible Chronic Kidney Disease.    Anion gap 20 (H) 5 - 15    Comment: Performed at [facility], [place] [address] [place], [place] [address]  CBC WITH DIFFERENTIAL     Status: Abnormal   Collection Time: 03/25/2018  6:14 PM  Result Value Ref Range   WBC 40.4 (H) 4.0 - 10.5 K/uL   RBC 3.07 (L) 3.87 - 5.11 MIL/uL   Hemoglobin 10.6 (L) 12.0 - 15.0 g/dL   HCT 34.0 (L) 36.0 - 46.0 %   MCV 110.7 (H) 78.0 - 100.0 fL   MCH 34.5 (H) 26.0 - 34.0 pg   MCHC 31.2 30.0 - 36.0 g/dL   RDW 23.3 (H) 11.5 - 15.5 %   Platelets 52 (L) 150 - 400 K/uL    Comment: RESULT REPEATED AND VERIFIED SPECIMEN CHECKED FOR CLOTS PLATELET COUNT CONFIRMED BY SMEAR    Neutrophils Relative % 41 %   Lymphocytes Relative 11 %   Monocytes Relative 3 %   Eosinophils Relative 3 %   Basophils Relative 0 %   Band Neutrophils 39 %   Metamyelocytes Relative 1 %   Myelocytes 2 %   Promyelocytes Relative 0 %   Blasts 0 %   nRBC 2 (H) 0 /100 WBC   Other 0 %   Neutro Abs 33.6 (H) 1.7 - 7.7 K/uL   Lymphs Abs 4.4 (H) 0.7 - 4.0 K/uL   Monocytes Absolute 1.2 (H) 0.1 - 1.0 K/uL   Eosinophils Absolute 1.2 (H) 0.0 - 0.7 K/uL   Basophils Absolute 0.0 0.0 - 0.1 K/uL   RBC Morphology RARE NRBCs     Comment: POLYCHROMASIA PRESENT CRENATED RBCs BURR CELLS    WBC Morphology INCREASED BANDS (>20% BANDS)     Comment: MODERATE LEFT SHIFT (>5% METAS AND MYELOS,OCC PRO NOTED) Performed at [facility] [address] [place], [place] [address]  Dg Chest Portable 1 View  Result Date: 04/07/2018 CLINICAL DATA:  Respiratory  distress EXAM: PORTABLE CHEST 1 VIEW COMPARISON:  None. FINDINGS: Right-sided central venous port tip over the proximal right atrium. Irregular left pleural effusion or pleural disease. Angular distortion of the left mediastinal silhouette which may be due to combination of pleural disease and adenopathy. There are multiple bilateral nodular opacities suspect for pulmonary nodules/metastatic disease. Additional ill-defined bilateral ground-glass opacity may reflect superimposed pneumonia. Suspected right hilar adenopathy. Heart size within normal limits. No pneumothorax. Air distention of bowel in the epigastric region IMPRESSION: 1. Moderate left irregular pleural effusion or pleural disease. Angular deformity of the left mediastinal silhouette may be due to combination of pleural disease and adenopathy 2. Multiple bilateral nodular foci of opacity suspect for metastatic nodules. Additional associated ground-glass opacity within the right greater than left lung fields may reflect superimposed pneumonia. There is probable right hilar enlargement/adenopathy Electronically Signed   By: Donavan Foil M.D.   On: 04/01/2018 18:26    Pending Labs Unresulted Labs (From admission, onward)    Start     Ordered   04/02/2018 1755  Blood Culture (routine x 2)  BLOOD CULTURE X 2,   STAT     03/28/2018 1755   04/09/2018 1755  Urinalysis, Routine w reflex microscopic  STAT,   STAT     03/23/2018 1755          Vitals/Pain Today's Vitals   03/20/2018 1916 04/06/2018 1945 03/19/2018 1956 03/28/2018 2000  BP:  (!) 82/60 97/73 96/73   Pulse:  (!) 108 (!) 109 (!) 106  Resp:  (!) 32 (!) 31 (!) 30  Temp:      TempSrc:      SpO2: 100% 95% (!) 89% 97%    Isolation Precautions No active isolations  Medications Medications  sodium chloride 0.9 % bolus 1,000 mL (1,000 mLs Intravenous New Bag/Given 03/30/2018 1945)    Mobility non-ambulatory

## 2018-04-12 NOTE — ED Triage Notes (Addendum)
Patient arrived via [facility] from home. Yesterday patient started to have increased lethargic. Patient is normally lethargic but has increased worse in the past couple hours. Patient 02 was low 60s. Patient on non-rebreather with EMS. Hx. 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 Husband is on the way with daughter and son. 2 NPA each nostril. Hospice is planned to come next week to discuss it with family. Patient was getting chemo treatments at one point. Patient is a [facility] patient.  18g in left AC.

## 2018-04-12 NOTE — ED Provider Notes (Signed)
[facility] DEPT Provider Note   CSN: [id number] Arrival date & time: 04/11/2018  1741     History   Chief Complaint Chief Complaint  Patient presents with  . Altered Mental Status  . Cancer    HPI Catherine Robles is a 47 y.o. female.  HPI   47 year old female with past medical history of metastatic cancer here with shortness of breath.  History limited secondary to patient being in extremis.  Per report, patient had acute onset of shortness of breath just about 30 meds prior to arrival.  She is satting in the mid 60s on arrival.  She currently receives her care from [facility] but lives in [place].  She had previously been referred to hospice but is full code per EMS.  On arrival, patient dyspneic and unable to provide history.  Level 5 caveat invoked as remainder of history, ROS, and physical exam limited due to patient's AMS.   Past Medical History:  Diagnosis Date  . Cancer [facility])     Patient Active Problem List   Diagnosis Date Noted  . Acute respiratory failure with hypoxia ([place]) 03/30/2018  . Sepsis ([place]) 04/05/2018  . HCAP (healthcare-associated pneumonia) 03/30/2018  . Macrocytic anemia 04/04/2018  . Pain of right lower extremity 09/18/2017  . Sciatica of right side 09/18/2017    History reviewed. No pertinent surgical history.   OB History   None      Home Medications    Prior to Admission medications   Medication Sig Start Date End Date Taking? Authorizing Provider  acetaminophen (TYLENOL) 500 MG tablet Take 1,000 mg by mouth every 6 (six) hours as needed for fever.   Yes [provider]  oxyCODONE (OXY IR/ROXICODONE) 5 MG immediate release tablet Take 5 mg by mouth every 4 (four) hours as needed for moderate pain, severe pain or breakthrough pain.   Yes [provider]  cyclobenzaprine (FLEXERIL) 10 MG tablet Take 1 tablet (10 mg total) by mouth at bedtime. Patient not taking: Reported on  03/16/2018 09/18/17   Horald Pollen, MD    Family History Family History  Problem Relation Age of Onset  . Cancer Neg Hx     Social History Social History   Tobacco Use  . Smoking status: Never Smoker  . Smokeless tobacco: Never Used  Substance Use Topics  . Alcohol use: No    Frequency: Never  . Drug use: No     Allergies   Patient has no known allergies.   Review of Systems Review of Systems  Unable to perform ROS: Mental status change     Physical Exam Updated Vital Signs BP 99/75   Pulse (!) 106   Temp (!) 94.8 F (34.9 C) (Rectal)   Resp (!) 35   SpO2 98%   Physical Exam  Constitutional: She appears distressed.  Ill-appearing  Eyes: Pupils are equal, round, and reactive to light. Conjunctivae are normal.  Neck: Neck supple.  Cardiovascular: Tachycardia present.  Pulmonary/Chest: Accessory muscle usage present. Tachypnea noted. She is in respiratory distress. She has decreased breath sounds. She has rales.  Abdominal: Soft. She exhibits no distension. There is no tenderness.  Musculoskeletal: She exhibits no edema.  Neurological:  Drowsy, but intermittently able to follow commands  Skin: Skin is warm. Capillary refill takes less than 2 seconds. She is diaphoretic.     ED Treatments / Results  Labs (all labs ordered are listed, but only abnormal results are displayed) Labs Reviewed  COMPREHENSIVE METABOLIC PANEL - Abnormal; Notable for the following components:      Result Value   Sodium 134 (*)    Potassium 5.6 (*)    CO2 15 (*)    Glucose, Bld 184 (*)    Calcium 7.9 (*)    Total Protein 5.7 (*)    Albumin 1.4 (*)    Alkaline Phosphatase 388 (*)    Total Bilirubin 1.3 (*)    Anion gap 20 (*)    All other components within normal limits  CBC WITH DIFFERENTIAL/PLATELET - Abnormal; Notable for the following components:   WBC 40.4 (*)    RBC 3.07 (*)    Hemoglobin 10.6 (*)    HCT 34.0 (*)    MCV 110.7 (*)    MCH 34.5 (*)    RDW 23.3  (*)    Platelets 52 (*)    nRBC 2 (*)    Neutro Abs 33.6 (*)    Lymphs Abs 4.4 (*)    Monocytes Absolute 1.2 (*)    Eosinophils Absolute 1.2 (*)    All other components within normal limits  I-STAT CG4 LACTIC ACID, ED - Abnormal; Notable for the following components:   Lactic Acid, Venous 12.21 (*)    All other components within normal limits  I-STAT CHEM 8, ED - Abnormal; Notable for the following components:   Sodium 130 (*)    Potassium 5.9 (*)    Glucose, Bld 156 (*)    Calcium, Ion 0.96 (*)    TCO2 18 (*)    Hemoglobin 11.9 (*)    HCT 35.0 (*)    All other components within normal limits  CULTURE, BLOOD (ROUTINE X 2)  CULTURE, BLOOD (ROUTINE X 2)  MRSA PCR SCREENING  I-STAT TROPONIN, ED    EKG EKG Interpretation  Date/Time:  Saturday April 12 2018 18:15:46 EDT Ventricular Rate:  115 PR Interval:    QRS Duration: 90 QT Interval:  343 QTC Calculation: 475 R Axis:   103 Text Interpretation:  Sinus tachycardia Right axis deviation Borderline repolarization abnormality No old tracing to compare Confirmed by Duffy Bruce [phone]) on 03/16/2018 11:45:12 PM   Radiology Dg Chest Portable 1 View  Result Date: 03/18/2018 CLINICAL DATA:  Respiratory distress EXAM: PORTABLE CHEST 1 VIEW COMPARISON:  None. FINDINGS: Right-sided central venous port tip over the proximal right atrium. Irregular left pleural effusion or pleural disease. Angular distortion of the left mediastinal silhouette which may be due to combination of pleural disease and adenopathy. There are multiple bilateral nodular opacities suspect for pulmonary nodules/metastatic disease. Additional ill-defined bilateral ground-glass opacity may reflect superimposed pneumonia. Suspected right hilar adenopathy. Heart size within normal limits. No pneumothorax. Air distention of bowel in the epigastric region IMPRESSION: 1. Moderate left irregular pleural effusion or pleural disease. Angular deformity of the left  mediastinal silhouette may be due to combination of pleural disease and adenopathy 2. Multiple bilateral nodular foci of opacity suspect for metastatic nodules. Additional associated ground-glass opacity within the right greater than left lung fields may reflect superimposed pneumonia. There is probable right hilar enlargement/adenopathy Electronically Signed   By: Donavan Foil M.D.   On: 04/10/2018 18:26    Procedures Procedures (including critical care time)  Medications Ordered in ED Medications  acetaminophen (TYLENOL) tablet 650 mg (has no administration in time range)    Or  acetaminophen (TYLENOL) suppository 650 mg (has no administration in time range)  ondansetron (ZOFRAN) tablet 4 mg (has no administration in time range)  Or  ondansetron (ZOFRAN) injection 4 mg (has no administration in time range)  morphine 100mg  in NS 176mL (1mg /mL) infusion - premix (1 mg/hr Intravenous New Bag/Given 04/10/2018 2310)  sodium chloride 0.9 % bolus 1,000 mL (0 mLs Intravenous Stopped 03/23/2018 2047)  ketorolac (TORADOL) 30 MG/ML injection 30 mg (30 mg Intravenous Given 04/09/2018 2201)     Initial Impression / Assessment and Plan / ED Course  I have reviewed the triage vital signs and the nursing notes.  Pertinent labs & imaging results that were available during my care of the patient were reviewed by me and considered in my medical decision making (see chart for details).     47 year old female here with severe respiratory distress.  Imaging is concerning for likely metastatic lung disease with new multifocal pneumonia.  She has a lactic acid of 12.  White count is 40,000.  Concern for severe, overwhelming sepsis and respiratory failure.  Patient initially given fluids and IV antibiotics.  Had a long discussion with her husband.  Patient was previously considering hospice and would not like escalation of care, but husband does not currently have capacity for ongoing management at home.  I discussed  with the hospice RN on-call.  Will admit the patient for palliative care at this time, with plan to transition to home hospice in the morning. DNR/DNI.  Final Clinical Impressions(s) / ED Diagnoses   Final diagnoses:  Septic shock ([place])  Acute respiratory failure with hypoxia ([place])  Metastatic cancer [facility])    ED Discharge Orders    None       Duffy Bruce, MD 03/21/2018 2346

## 2018-04-12 NOTE — H&P (Signed)
History and Physical    Catherine Robles WUJ:[id number] DOB: 12-24-1970 DOA: 04/06/2018  PCP: System, Pcp Not In  Patient coming from: Home.   History obtained from patient's husband.  Chief Complaint: Shortness of breath.  HPI: Catherine Robles is a 47 y.o. female with history of metastatic osteosarcoma as per the patient has been who was on chemotherapy until 3 weeks ago and was told that she may need to seek hospice was getting she suddenly short of breath since 3 hours before arriving to the ER.  Has not had any chest pain fever chills nausea vomiting abdominal pain or diarrhea.  ED Course: In the ER patient was found to be in acute respiratory distress and was placed on BiPAP.  Lactate was markedly elevated at 12.  Chest x-ray shows possible pneumonia with pleural effusion and multiple metastatic lesions.  Labs also revealed leukocytosis.  At this time patient has been has requested comfort measures and that was the patient's wishes also.  Review of Systems: As per HPI, rest all negative.   Past Medical History:  Diagnosis Date  . Cancer [facility])     History reviewed. No pertinent surgical history.   reports that she has never smoked. She has never used smokeless tobacco. She reports that she does not drink alcohol or use drugs.  No Known Allergies  Family History  Problem Relation Age of Onset  . Cancer Neg Hx     Prior to Admission medications   Medication Sig Start Date End Date Taking? Authorizing Provider  acetaminophen (TYLENOL) 500 MG tablet Take 1,000 mg by mouth every 6 (six) hours as needed for fever.   Yes [provider]  oxyCODONE (OXY IR/ROXICODONE) 5 MG immediate release tablet Take 5 mg by mouth every 4 (four) hours as needed for moderate pain, severe pain or breakthrough pain.   Yes [provider]  cyclobenzaprine (FLEXERIL) 10 MG tablet Take 1 tablet (10 mg total) by mouth at bedtime. Patient not taking: Reported on 03/18/2018 09/18/17    Horald Pollen, MD    Physical Exam: Vitals:   03/24/2018 2045 03/24/2018 2046 03/25/2018 2100 03/22/2018 2140  BP: 101/69  99/75   Pulse: (!) 110 (!) 110 (!) 106   Resp: (!) 32 (!) 27 (!) 35   Temp:    (!) 94.8 F (34.9 C)  TempSrc:    Rectal  SpO2: 90% (!) 87% 98%       Constitutional: Moderately built and poorly nourished. Vitals:   03/18/2018 2045 03/31/2018 2046 04/07/2018 2100 03/27/2018 2140  BP: 101/69  99/75   Pulse: (!) 110 (!) 110 (!) 106   Resp: (!) 32 (!) 27 (!) 35   Temp:    (!) 94.8 F (34.9 C)  TempSrc:    Rectal  SpO2: 90% (!) 87% 98%    Eyes: Anicteric no pallor. ENMT: No discharge from the ears eyes nose or mouth. Neck: No mass palpated no neck rigidity. Respiratory: No rhonchi or crepitations. Cardiovascular: S1-S2 heard no murmurs appreciated. Abdomen: Soft nontender bowel sounds present. Musculoskeletal: Appears cyanotic.  No edema. Skin: Appears cyanotic. Neurologic: Alert awake oriented moves all extremities. Psychiatric: Alert awake oriented.   Labs on Admission: I have personally reviewed following labs and imaging studies  CBC: Recent Labs  Lab 03/29/2018 1810 04/12/18 1814  WBC  --  40.4*  NEUTROABS  --  33.6*  HGB 11.9* 10.6*  HCT 35.0* 34.0*  MCV  --  110.7*  PLT  --  52*  Basic Metabolic Panel: Recent Labs  Lab 03/22/2018 1810 03/26/2018 1814  NA 130* 134*  K 5.9* 5.6*  CL 101 99  CO2  --  15*  GLUCOSE 156* 184*  BUN 16 14  CREATININE 0.50 0.88  CALCIUM  --  7.9*   GFR: CrCl cannot be calculated (Unknown ideal weight.). Liver Function Tests: Recent Labs  Lab 03/29/2018 1814  AST 25  ALT <5  ALKPHOS 388*  BILITOT 1.3*  PROT 5.7*  ALBUMIN 1.4*   No results for input(s): LIPASE, AMYLASE in the last 168 hours. No results for input(s): AMMONIA in the last 168 hours. Coagulation Profile: No results for input(s): INR, PROTIME in the last 168 hours. Cardiac Enzymes: No results for input(s): CKTOTAL, CKMB, CKMBINDEX,  TROPONINI in the last 168 hours. BNP (last 3 results) No results for input(s): PROBNP in the last 8760 hours. HbA1C: No results for input(s): HGBA1C in the last 72 hours. CBG: No results for input(s): GLUCAP in the last 168 hours. Lipid Profile: No results for input(s): CHOL, HDL, LDLCALC, TRIG, CHOLHDL, LDLDIRECT in the last 72 hours. Thyroid Function Tests: No results for input(s): TSH, T4TOTAL, FREET4, T3FREE, THYROIDAB in the last 72 hours. Anemia Panel: No results for input(s): VITAMINB12, FOLATE, FERRITIN, TIBC, IRON, RETICCTPCT in the last 72 hours. Urine analysis:    Component Value Date/Time   COLORURINE YELLOW 05/29/2007 1838   APPEARANCEUR HAZY (A) 05/29/2007 1838   LABSPEC 1.016 05/29/2007 1838   PHURINE 7.0 05/29/2007 1838   GLUCOSEU 500 (A) 05/29/2007 1838   HGBUR NEGATIVE 05/29/2007 1838   BILIRUBINUR NEGATIVE 05/29/2007 1838   KETONESUR NEGATIVE 05/29/2007 1838   PROTEINUR 100 (A) 05/29/2007 1838   UROBILINOGEN 0.2 05/29/2007 1838   NITRITE POSITIVE (A) 05/29/2007 1838   LEUKOCYTESUR SMALL (A) 05/29/2007 1838   Sepsis Labs: @LABRCNTIP (procalcitonin:4,lacticidven:4) )No results found for this or any previous visit (from the past 240 hour(s)).   Radiological Exams on Admission: Dg Chest Portable 1 View  Result Date: 03/19/2018 CLINICAL DATA:  Respiratory distress EXAM: PORTABLE CHEST 1 VIEW COMPARISON:  None. FINDINGS: Right-sided central venous port tip over the proximal right atrium. Irregular left pleural effusion or pleural disease. Angular distortion of the left mediastinal silhouette which may be due to combination of pleural disease and adenopathy. There are multiple bilateral nodular opacities suspect for pulmonary nodules/metastatic disease. Additional ill-defined bilateral ground-glass opacity may reflect superimposed pneumonia. Suspected right hilar adenopathy. Heart size within normal limits. No pneumothorax. Air distention of bowel in the epigastric  region IMPRESSION: 1. Moderate left irregular pleural effusion or pleural disease. Angular deformity of the left mediastinal silhouette may be due to combination of pleural disease and adenopathy 2. Multiple bilateral nodular foci of opacity suspect for metastatic nodules. Additional associated ground-glass opacity within the right greater than left lung fields may reflect superimposed pneumonia. There is probable right hilar enlargement/adenopathy Electronically Signed   By: Donavan Foil M.D.   On: 03/21/2018 18:26    EKG: Independently reviewed.  Sinus tachycardia.  Assessment/Plan Active Problems:   Acute respiratory failure with hypoxia ([facility])   Sepsis ([facility])   HCAP (healthcare-associated pneumonia)   Macrocytic anemia    1. Acute respiratory failure with hypoxia likely from pneumonia and progressive metastatic lesions and pleural effusion. 2. Sepsis likely from pneumonia. 3. Macrocytic anemia could be related to patient's malignancy. 4. Metastatic malignancy. 5. Thrombocytopenia could be related to patient's malignancy and chemotherapy and also now presently septic.  Plan -at this time after extensive discussion with patient  husband plans to make patient full comfort measures with no further labs and morphine drip has been started.  Palliative care consult.   DVT prophylaxis: SCDs. Code Status: DNR. Family Communication: Patient husband. Disposition Plan: To be determined. Consults called: Palliative care. Admission status: Inpatient.   Rise Patience MD [facility] Pager [phone].  If 7PM-7AM, please contact night-coverage [email or web address] Password [facility]  04/09/2018, 9:48 PM

## 2018-04-12 NOTE — ED Notes (Signed)
Respiratory at bedside. Placing patient on BIPAP.

## 2018-04-12 NOTE — Progress Notes (Addendum)
Pt's husband and children Apolonio Schneiders 8 and Dominica Severin 4) were bedside when I arrived. During my visit I provided presence, nutritional and hydration needs, and emotional support for dad. He was appropriately tearful, but trying to be strong w/the children. At one point he asked me to stay w/them while he left the room for a few minutes - he had just been given a medical update and was pretty tearful. He asked me to step outside of the room and talk about what to do when she passes. He said he didn't know what to do and indicated finances may be an issue. I explained to him about funeral home services to call around to get the best prices and I also suggested he check on public plots rather than private cemeteries.  He was thankful for that information. He said he has not told the children and does not know how to handle it. He said his mother wants him to send the children to her in [place]. He was tearful when explaining that. He said he felt not telling them and they would realize it when she is gone. We talked about starting the conversation w/them to give a current update and answer any questions for them.  He realizes that may avoid them having problems trusting him in the future. I told him, as staff we are here to support him and if he would like support during or after the conversation I would be happy to provide additional support. Please page if support is needed prior to my next visit. [place], MDiv   03/16/2018 2000  Clinical Encounter Type  Visited With Family

## 2018-04-12 NOTE — ED Notes (Signed)
MD notifed of patient 02 level on non-rebreather. Requested to move patient to Recess B.

## 2018-04-12 NOTE — ED Notes (Signed)
Called to give report. Nurse will call back 

## 2018-04-12 NOTE — ED Notes (Signed)
MD AND RN NOTIFIED OF PATIENT'S LACTIC ACID LEVEL OF 12.21

## 2018-04-12 NOTE — ED Notes (Signed)
Patient has been notified that urine is needed.

## 2018-04-12 NOTE — ED Notes (Signed)
Respiratory on the way to help transport patient

## 2018-04-12 NOTE — ED Notes (Signed)
Called to give report to nurse. Nurse will call back in 5 min

## 2018-04-12 NOTE — ED Notes (Signed)
Family at bedside. MD made aware.

## 2018-04-12 NOTE — ED Notes (Signed)
Bed: UD67 Expected date:  Expected time:  Means of arrival:  Comments: EMS- 47 y/o cancer patient-sepsis

## 2018-04-13 LAB — MRSA PCR SCREENING: MRSA by PCR: NEGATIVE

## 2018-04-13 MED ORDER — SODIUM CHLORIDE 0.9 % IV BOLUS: 500.0000 mL | Freq: Once | INTRAVENOUS | Status: DC

## 2018-04-15 NOTE — Progress Notes (Signed)
94.5 mL of morphine wasted and verified with Festus Aloe, RN.

## 2018-04-15 NOTE — Progress Notes (Signed)
Chaplain called to bedside and I arrived moments after pt passed. Her husband was bedside, her children who were asleep were taken out of the room. Her husband sobbed appropriately. He was distraught. They had been married 25 years and had two children (8 and 4).  When he was more composed, he wanted to know what to do next. I gave him a list of funeral homes to call and the number for patient bed control to call and let them know to which funeral they should send his wife. He said they are Christians and was at peace with where she is, but admitted to how hard this (his loss) is. He spent time with her alone after which I helped him out to his car with the children. He was very grateful for staff support. [place], MDiv   04/22/18 0300  Clinical Encounter Type  Visited With Family

## 2018-04-15 DEATH — deceased

## 2018-04-17 LAB — CULTURE, BLOOD (ROUTINE X 2)
CULTURE: NO GROWTH
CULTURE: NO GROWTH
Special Requests: ADEQUATE
Special Requests: ADEQUATE

## 2018-05-16 NOTE — Discharge Summary (Signed)
Death summary -   Chief Complaint: Shortness of breath.  HPI: Catherine Robles is a 47 y.o. female with history of metastatic osteosarcoma as per the patient has been who was on chemotherapy until 3 weeks ago and was told that she may need to seek hospice was getting she suddenly short of breath since 3 hours before arriving to the ER.  Has not had any chest pain fever chills nausea vomiting abdominal pain or diarrhea.  ED Course: In the ER patient was found to be in acute respiratory distress and was placed on BiPAP.  Lactate was markedly elevated at 12.  Chest x-ray shows possible pneumonia with pleural effusion and multiple metastatic lesions.  Labs also revealed leukocytosis.  At this time patient has been has requested comfort measures and that was the patient's wishes also.  Course in the hospital -patient was placed on morphine drip for comfort measures only.  Around 2:15 AM 05/11/18 patient stopped breathing and had a history of a and was pronounced dead and family notified.  Patient was a DNR.   Principal diagnosis -  #1.  Acute respiratory failure with hypoxia secondary to progressive metastatic disease and possible pneumonia. #2.  Possible sepsis secondary to pneumonia. #3.  Metastatic osteosarcoma. #4.  Thrombocytopenia likely from malignancy and chemotherapy.  Gean Birchwood. MD.
